# Patient Record
Sex: Male | Born: 1980 | Hispanic: No | Marital: Single | Smoking: Current every day smoker
Health system: Southern US, Community
[De-identification: ages and names within clinical notes are randomized; demographics above are authoritative.]

## PROBLEM LIST (undated history)

## (undated) DIAGNOSIS — I10 Essential (primary) hypertension: Secondary | ICD-10-CM

---
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
## 2014-05-12 ENCOUNTER — Encounter (HOSPITAL_BASED_OUTPATIENT_CLINIC_OR_DEPARTMENT_OTHER): Payer: Self-pay

## 2014-05-12 ENCOUNTER — Emergency Department (HOSPITAL_BASED_OUTPATIENT_CLINIC_OR_DEPARTMENT_OTHER)
Admission: EM | Admit: 2014-05-12 | Discharge: 2014-05-12 | Disposition: A | Discharge status: Home / Self Care | Payer: 59 | Attending: Emergency Medicine | Admitting: Emergency Medicine

## 2014-05-12 ENCOUNTER — Emergency Department (HOSPITAL_BASED_OUTPATIENT_CLINIC_OR_DEPARTMENT_OTHER): Payer: 59

## 2014-05-12 DIAGNOSIS — Z720 Tobacco use: Secondary | ICD-10-CM | POA: Diagnosis not present

## 2014-05-12 DIAGNOSIS — I10 Essential (primary) hypertension: Secondary | ICD-10-CM | POA: Diagnosis not present

## 2014-05-12 DIAGNOSIS — R0789 Other chest pain: Secondary | ICD-10-CM | POA: Diagnosis not present

## 2014-05-12 DIAGNOSIS — R079 Chest pain, unspecified: Secondary | ICD-10-CM | POA: Diagnosis present

## 2014-05-12 HISTORY — DX: Essential (primary) hypertension: I10

## 2014-05-12 MED ORDER — CYCLOBENZAPRINE HCL 10 MG PO TABS: 10.0000 mg | ORAL_TABLET | Freq: Once | ORAL | Status: AC

## 2014-05-12 MED ORDER — CYCLOBENZAPRINE HCL 10 MG PO TABS
10.0000 mg | ORAL_TABLET | Freq: Once | ORAL | Status: AC
  Administered 2014-05-12: 10 mg via ORAL
  Filled 2014-05-12: qty 1

## 2014-05-12 MED ORDER — IBUPROFEN 800 MG PO TABS
800.0000 mg | ORAL_TABLET | Freq: Once | ORAL | Status: AC
  Administered 2014-05-12: 800 mg via ORAL
  Filled 2014-05-12: qty 1

## 2014-05-12 MED ORDER — IBUPROFEN 800 MG PO TABS: 800.0000 mg | ORAL_TABLET | Freq: Three times a day (TID) | ORAL | Status: AC | PRN

## 2014-05-12 NOTE — ED Notes (Signed)
C/o right sided chest pain. Reports was worse when working last night. Denies pain at this time. Sts it's gotten better since getting off work.

## 2014-05-12 NOTE — ED Notes (Signed)
MD at bedside. 

## 2014-05-12 NOTE — Discharge Instructions (Signed)
Chest Wall Pain °Chest wall pain is pain in or around the bones and muscles of your chest. It may take up to 6 weeks to get better. It may take longer if you must stay physically active in your work and activities.  °CAUSES  °Chest wall pain may happen on its own. However, it may be caused by: °· A viral illness like the flu. °· Injury. °· Coughing. °· Exercise. °· Arthritis. °· Fibromyalgia. °· Shingles. °HOME CARE INSTRUCTIONS  °· Avoid overtiring physical activity. Try not to strain or perform activities that cause pain. This includes any activities using your chest or your abdominal and side muscles, especially if heavy weights are used. °· Put ice on the sore area. °¨ Put ice in a plastic bag. °¨ Place a towel between your skin and the bag. °¨ Leave the ice on for 15-20 minutes per hour while awake for the first 2 days. °· Only take over-the-counter or prescription medicines for pain, discomfort, or fever as directed by your caregiver. °SEEK IMMEDIATE MEDICAL CARE IF:  °· Your pain increases, or you are very uncomfortable. °· You have a fever. °· Your chest pain becomes worse. °· You have new, unexplained symptoms. °· You have nausea or vomiting. °· You feel sweaty or lightheaded. °· You have a cough with phlegm (sputum), or you cough up blood. °MAKE SURE YOU:  °· Understand these instructions. °· Will watch your condition. °· Will get help right away if you are not doing well or get worse. °Document Released: 03/12/2005 Document Revised: 06/04/2011 Document Reviewed: 11/06/2010 °ExitCare® Patient Information ©2015 [employer]. This information is not intended to replace advice given to you by your health care provider. Make sure you discuss any questions you have with your health care provider. ° °Hypertension °Hypertension, commonly called high blood pressure, is when the force of blood pumping through your arteries is too strong. Your arteries are the blood vessels that carry blood from your heart  throughout your body. A blood pressure reading consists of a higher number over a lower number, such as 110/72. The higher number (systolic) is the pressure inside your arteries when your heart pumps. The lower number (diastolic) is the pressure inside your arteries when your heart relaxes. Ideally you want your blood pressure below 120/80. °Hypertension forces your heart to work harder to pump blood. Your arteries may become narrow or stiff. Having hypertension puts you at risk for heart disease, stroke, and other problems.  °RISK FACTORS °Some risk factors for high blood pressure are controllable. Others are not.  °Risk factors you cannot control include:  °· Race. You may be at higher risk if you are African American. °· Age. Risk increases with age. °· Gender. Men are at higher risk than women before age 45 years. After age 65, women are at higher risk than men. °Risk factors you can control include: °· Not getting enough exercise or physical activity. °· Being overweight. °· Getting too much fat, sugar, calories, or salt in your diet. °· Drinking too much alcohol. °SIGNS AND SYMPTOMS °Hypertension does not usually cause signs or symptoms. Extremely high blood pressure (hypertensive crisis) may cause headache, anxiety, shortness of breath, and nosebleed. °DIAGNOSIS  °To check if you have hypertension, your health care provider will measure your blood pressure while you are seated, with your arm held at the level of your heart. It should be measured at least twice using the same arm. Certain conditions can cause a difference in blood pressure between   your right and left arms. A blood pressure reading that is higher than normal on one occasion does not mean that you need treatment. If one blood pressure reading is high, ask your health care provider about having it checked again. TREATMENT  Treating high blood pressure includes making lifestyle changes and possibly taking medicine. Living a healthy lifestyle can  help lower high blood pressure. You may need to change some of your habits. Lifestyle changes may include:  Following the DASH diet. This diet is high in fruits, vegetables, and whole grains. It is low in salt, red meat, and added sugars.  Getting at least 2 hours of brisk physical activity every week.  Losing weight if necessary.  Not smoking.  Limiting alcoholic beverages.  Learning ways to reduce stress. If lifestyle changes are not enough to get your blood pressure under control, your health care provider may prescribe medicine. You may need to take more than one. Work closely with your health care provider to understand the risks and benefits. HOME CARE INSTRUCTIONS  Have your blood pressure rechecked as directed by your health care provider.   Take medicines only as directed by your health care provider. Follow the directions carefully. Blood pressure medicines must be taken as prescribed. The medicine does not work as well when you skip doses. Skipping doses also puts you at risk for problems.   Do not smoke.   Monitor your blood pressure at home as directed by your health care provider. SEEK MEDICAL CARE IF:   You think you are having a reaction to medicines taken.  You have recurrent headaches or feel dizzy.  You have swelling in your ankles.  You have trouble with your vision. SEEK IMMEDIATE MEDICAL CARE IF:  You develop a severe headache or confusion.  You have unusual weakness, numbness, or feel faint.  You have severe chest or abdominal pain.  You vomit repeatedly.  You have trouble breathing. MAKE SURE YOU:   Understand these instructions.  Will watch your condition.  Will get help right away if you are not doing well or get worse. Document Released: 03/12/2005 Document Revised: 07/27/2013 Document Reviewed: 01/02/2013 [facility] Patient Information 2015 [place], [place]. This information is not intended to replace advice given to you by your  health care provider. Make sure you discuss any questions you have with your health care provider.  Chest Wall Pain Chest wall pain is pain in or around the bones and muscles of your chest. It may take up to 6 weeks to get better. It may take longer if you must stay physically active in your work and activities.  CAUSES  Chest wall pain may happen on its own. However, it may be caused by:  A viral illness like the flu.  Injury.  Coughing.  Exercise.  Arthritis.  Fibromyalgia.  Shingles. HOME CARE INSTRUCTIONS   Avoid overtiring physical activity. Try not to strain or perform activities that cause pain. This includes any activities using your chest or your abdominal and side muscles, especially if heavy weights are used.  Put ice on the sore area.  Put ice in a plastic bag.  Place a towel between your skin and the bag.  Leave the ice on for 15-20 minutes per hour while awake for the first 2 days.  Only take over-the-counter or prescription medicines for pain, discomfort, or fever as directed by your caregiver. SEEK IMMEDIATE MEDICAL CARE IF:   Your pain increases, or you are very uncomfortable.  You have a  fever.  Your chest pain becomes worse.  You have new, unexplained symptoms.  You have nausea or vomiting.  You feel sweaty or lightheaded.  You have a cough with phlegm (sputum), or you cough up blood. MAKE SURE YOU:   Understand these instructions.  Will watch your condition.  Will get help right away if you are not doing well or get worse. Document Released: 03/12/2005 Document Revised: 06/04/2011 Document Reviewed: 11/06/2010 [facility] Patient Information 2015 [place], [place]. This information is not intended to replace advice given to you by your health care provider. Make sure you discuss any questions you have with your health care provider.

## 2014-05-12 NOTE — ED Provider Notes (Signed)
CSN: [id number]     Arrival date & time 05/12/14  0909 History   First MD Initiated Contact with Patient 05/12/14 [phone]     Chief Complaint  Patient presents with  . Chest Pain     (Consider location/radiation/quality/duration/timing/severity/associated sxs/prior Treatment) Patient is a 34 y.o. male presenting with chest pain. The history is provided by the patient.  Chest Pain Pain location:  R chest Pain quality: aching   Pain radiates to:  Does not radiate Pain radiates to the back: no   Pain severity:  Mild Onset quality:  Gradual Timing:  Intermittent Progression:  Unchanged Chronicity:  New Context: movement   Relieved by:  Nothing Exacerbated by: Using R arm. Associated symptoms: no abdominal pain, no cough, no fever, no shortness of breath and not vomiting     Past Medical History  Diagnosis Date  . Hypertension    History reviewed. No pertinent past surgical history. No family history on file. History  Substance Use Topics  . Smoking status: Current Every Day Smoker -- 0.50 packs/day    Types: Cigarettes  . Smokeless tobacco: Not on file  . Alcohol Use: No    Review of Systems  Constitutional: Negative for fever.  Respiratory: Negative for cough and shortness of breath.   Cardiovascular: Positive for chest pain.  Gastrointestinal: Negative for vomiting and abdominal pain.  All other systems reviewed and are negative.     Allergies  Review of patient's allergies indicates no known allergies.  Home Medications   Prior to Admission medications   Not on File   BP 157/85 mmHg  Pulse 86  Temp(Src) 98.3 F (36.8 C) (Oral)  Resp 18  Ht  (1.702 m)  Wt 205 lb (92.987 kg)  BMI 32.10 kg/m2  SpO2 100% Physical Exam  Constitutional: He is oriented to person, place, and time. He appears well-developed and well-nourished. No distress.  HENT:  Head: Normocephalic and atraumatic.  Mouth/Throat: No oropharyngeal exudate.  Eyes: EOM are normal. Pupils  are equal, round, and reactive to light.  Neck: Normal range of motion. Neck supple.  Cardiovascular: Normal rate and regular rhythm.  Exam reveals no friction rub.   No murmur heard. Pulmonary/Chest: Effort normal and breath sounds normal. No respiratory distress. He has no wheezes. He has no rales. He exhibits tenderness (R central).  Abdominal: He exhibits no distension. There is no tenderness. There is no rebound.  Musculoskeletal: Normal range of motion. He exhibits no edema.  Neurological: He is alert and oriented to person, place, and time.  Skin: He is not diaphoretic.  Nursing note and vitals reviewed.   ED Course  Procedures (including critical care time) Labs Review Labs Reviewed - No data to display  Imaging Review Dg Chest 2 View  05/12/2014   CLINICAL DATA:  One day history of right-sided chest pain  EXAM: CHEST  2 VIEW  COMPARISON:  None.  FINDINGS: Lungs are clear. Heart size and pulmonary vascularity are normal. No adenopathy. No pneumothorax. No bone lesions.  IMPRESSION: No edema or consolidation.   Electronically Signed   By: Bretta Bang III M.D.   On: 05/12/2014 09:32     EKG Interpretation   Date/Time:  Wednesday May 12 2014 09:22:11 EST Ventricular Rate:  82 PR Interval:  166 QRS Duration: 82 QT Interval:  354 QTC Calculation: 413 R Axis:   59 Text Interpretation:  Normal sinus rhythm Normal ECG No prior for  comparison Confirmed by Gwendolyn Grant  MD, Chanetta Moosman ([id number]) on  05/12/2014 9:24:49 AM      MDM   Final diagnoses:  Chest wall pain  Essential hypertension    24M here with chest pain. Worse with movements. Pain with moving right arm occasionally, noticed it last night. Described as aching.  AFVSS here. Mildly hypertensive, counseled him on f/u with PCP to begin taking BP meds again - he quit after losing weight and stopping smoking.  His CP is reproducible with palpation of R central chest muscles. No concern for torn pectoralis muscle. Will  give NSAIDs, flexeril. Stable for discharge.    Elwin MochaBlair Rokhaya Quinn, MD 05/12/14 [phone]

## 2014-05-12 NOTE — ED Notes (Signed)
Patient transported to X-ray 

## 2014-10-08 ENCOUNTER — Emergency Department (HOSPITAL_BASED_OUTPATIENT_CLINIC_OR_DEPARTMENT_OTHER)
Admission: EM | Admit: 2014-10-08 | Discharge: 2014-10-08 | Disposition: A | Discharge status: Home / Self Care | Payer: 59 | Attending: Emergency Medicine | Admitting: Emergency Medicine

## 2014-10-08 ENCOUNTER — Encounter (HOSPITAL_BASED_OUTPATIENT_CLINIC_OR_DEPARTMENT_OTHER): Payer: Self-pay | Admitting: Emergency Medicine

## 2014-10-08 DIAGNOSIS — T2162XA Corrosion of second degree of abdominal wall, initial encounter: Secondary | ICD-10-CM | POA: Diagnosis not present

## 2014-10-08 DIAGNOSIS — X58XXXA Exposure to other specified factors, initial encounter: Secondary | ICD-10-CM | POA: Diagnosis not present

## 2014-10-08 DIAGNOSIS — Y998 Other external cause status: Secondary | ICD-10-CM | POA: Insufficient documentation

## 2014-10-08 DIAGNOSIS — I10 Essential (primary) hypertension: Secondary | ICD-10-CM | POA: Insufficient documentation

## 2014-10-08 DIAGNOSIS — T24632A Corrosion of second degree of left lower leg, initial encounter: Secondary | ICD-10-CM | POA: Diagnosis not present

## 2014-10-08 DIAGNOSIS — Y9389 Activity, other specified: Secondary | ICD-10-CM | POA: Insufficient documentation

## 2014-10-08 DIAGNOSIS — R21 Rash and other nonspecific skin eruption: Secondary | ICD-10-CM | POA: Insufficient documentation

## 2014-10-08 DIAGNOSIS — Y9289 Other specified places as the place of occurrence of the external cause: Secondary | ICD-10-CM | POA: Insufficient documentation

## 2014-10-08 DIAGNOSIS — Z720 Tobacco use: Secondary | ICD-10-CM | POA: Diagnosis not present

## 2014-10-08 DIAGNOSIS — T543X1A Toxic effect of corrosive alkalis and alkali-like substances, accidental (unintentional), initial encounter: Secondary | ICD-10-CM | POA: Insufficient documentation

## 2014-10-08 DIAGNOSIS — T304 Corrosion of unspecified body region, unspecified degree: Secondary | ICD-10-CM

## 2014-10-08 MED ORDER — SILVER SULFADIAZINE 1 % EX CREA
TOPICAL_CREAM | Freq: Once | CUTANEOUS | Status: AC
  Administered 2014-10-08: 1 via TOPICAL
  Filled 2014-10-08: qty 85

## 2014-10-08 MED ORDER — SILVER SULFADIAZINE 1 % EX CREA: TOPICAL_CREAM | CUTANEOUS | Status: AC

## 2014-10-08 MED ORDER — OXYCODONE-ACETAMINOPHEN 5-325 MG PO TABS: 1.0000 | ORAL_TABLET | Freq: Four times a day (QID) | ORAL | Status: AC | PRN

## 2014-10-08 MED ORDER — TETANUS-DIPHTH-ACELL PERTUSSIS 5-2.5-18.5 LF-MCG/0.5 IM SUSP
0.5000 mL | Freq: Once | INTRAMUSCULAR | Status: AC
  Administered 2014-10-08: 0.5 mL via INTRAMUSCULAR
  Filled 2014-10-08: qty 0.5

## 2014-10-08 NOTE — ED Notes (Signed)
silvadine cream applied to left lower lateral abdomen, covered with tefla pad, secured with paper tape. silvadine cream applied to left medial calf and right medial calf

## 2014-10-08 NOTE — Discharge Instructions (Signed)
Chemical Burn Many chemicals can burn the skin. A chemical burn should be flushed with cool water and checked by an emergency caregiver. Your skin is a natural barrier to infection. It is the largest organ of your body. Burns damage this natural protection. To help prevent infection, it is very important to follow your caregiver's instructions in the care of your burn.  Many industrial chemicals may cause burns. These chemicals include acids, alkalis, and organic compounds such as petroleum, phenol, bitumen, tar, and grease. When acids come in contact with the skin, they cause an immediate change in the skin.Acid burns produce significant pain and form a scab (eschar). Usually, the immediate skin changes are the only damage from an acid burn.However, exposure to formic acid, chromic acid, or hydrofluoric acid may affect the whole body and may even be life-threatening. Alkalis include lye, cement, lime, and many chemicals with "hydroxide" in their name.An alkali burn may be less apparent than an acid burn at first. However, alkalis may cause greater tissue damage.It is important to be aware of any chemicals you are using. Treat any exposure to skin, eyes, or mucous membranes (nose, mouth, throat) as a potential emergency. PREVENTION  Avoid exposure to toxic chemicals that can cause burns.  Store chemicals out of the reach of children.  Use protective gloves when handling dangerous chemicals. HOME CARE INSTRUCTIONS   Wash your hands well before changing your bandage.  Change your bandage as often as directed by your caregiver.  Remove the old bandage. If the bandage sticks, you may soak it off with cool, clean water.  Cleanse the burn thoroughly but gently with mild soap and water.  Pat the area dry with a clean, dry cloth.  Apply a thin layer of antibacterial cream to the burn.  Apply a clean bandage as instructed by your caregiver.  Keep the bandage as clean and dry as  possible.  Elevate the affected area for the first 24 hours, then as instructed by your caregiver.  Only take over-the-counter or prescription medicines for pain, discomfort, or fever as directed by your caregiver.  Keep all follow-up appointments.This is important. This is how your caregiver can tell if your treatment is working. SEEK IMMEDIATE MEDICAL CARE IF:   You develop excessive pain.  You develop redness, tenderness, swelling, or red streaks near the burn.  The burned area develops yellowish-white fluid (pus) or a bad smell.  You have a fever. MAKE SURE YOU:   Understand these instructions.  Will watch your condition.  Will get help right away if you are not doing well or get worse. Document Released: 12/17/2003 Document Revised: 06/04/2011 Document Reviewed: 08/07/2010 Exit[employer] Patient Information 2015 [employer]. This information is not intended to replace advice given to you by your health care provider. Make sure you discuss any questions you have with your health care provider.  
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
## 2014-10-08 NOTE — ED Notes (Signed)
Patient has a blistered area of skin with redness underneath to his left leg and left stomach area. Patient also has areas of a rash. Patient reports that he is having a burning pain. The patient also reports that it is itching.

## 2014-10-10 ENCOUNTER — Encounter (HOSPITAL_BASED_OUTPATIENT_CLINIC_OR_DEPARTMENT_OTHER): Payer: Self-pay | Admitting: Emergency Medicine

## 2014-10-10 DIAGNOSIS — Y939 Activity, unspecified: Secondary | ICD-10-CM | POA: Insufficient documentation

## 2014-10-10 DIAGNOSIS — T2162XA Corrosion of second degree of abdominal wall, initial encounter: Secondary | ICD-10-CM | POA: Insufficient documentation

## 2014-10-10 DIAGNOSIS — X58XXXA Exposure to other specified factors, initial encounter: Secondary | ICD-10-CM | POA: Diagnosis not present

## 2014-10-10 DIAGNOSIS — Y929 Unspecified place or not applicable: Secondary | ICD-10-CM | POA: Diagnosis not present

## 2014-10-10 DIAGNOSIS — T65894A Toxic effect of other specified substances, undetermined, initial encounter: Secondary | ICD-10-CM | POA: Diagnosis not present

## 2014-10-10 DIAGNOSIS — Z720 Tobacco use: Secondary | ICD-10-CM | POA: Diagnosis not present

## 2014-10-10 DIAGNOSIS — Y999 Unspecified external cause status: Secondary | ICD-10-CM | POA: Diagnosis not present

## 2014-10-10 DIAGNOSIS — I10 Essential (primary) hypertension: Secondary | ICD-10-CM | POA: Diagnosis not present

## 2014-10-10 DIAGNOSIS — T2142XA Corrosion of unspecified degree of abdominal wall, initial encounter: Secondary | ICD-10-CM | POA: Diagnosis present
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
## 2014-10-11 ENCOUNTER — Emergency Department (HOSPITAL_BASED_OUTPATIENT_CLINIC_OR_DEPARTMENT_OTHER)
Admission: EM | Admit: 2014-10-11 | Discharge: 2014-10-11 | Disposition: A | Discharge status: Home / Self Care | Payer: 59 | Attending: Emergency Medicine | Admitting: Emergency Medicine

## 2014-10-11 DIAGNOSIS — T304 Corrosion of unspecified body region, unspecified degree: Secondary | ICD-10-CM

## 2014-10-11 MED ORDER — HYDROCODONE-ACETAMINOPHEN 5-325 MG PO TABS
2.0000 | ORAL_TABLET | Freq: Once | ORAL | Status: AC
  Administered 2014-10-11: 2 via ORAL
  Filled 2014-10-11: qty 2

## 2014-10-11 MED ORDER — SILVER SULFADIAZINE 1 % EX CREA
TOPICAL_CREAM | CUTANEOUS | Status: AC
  Filled 2014-10-11: qty 85

## 2014-10-11 MED ORDER — NAPROXEN 250 MG PO TABS
500.0000 mg | ORAL_TABLET | Freq: Once | ORAL | Status: AC
Start: 2014-10-11 — End: 2014-10-11
  Administered 2014-10-11: 500 mg via ORAL
  Filled 2014-10-11: qty 2

## 2014-10-11 MED ORDER — HYDROCODONE-ACETAMINOPHEN 5-325 MG PO TABS: 1.0000 | ORAL_TABLET | Freq: Four times a day (QID) | ORAL | Status: AC | PRN

## 2014-10-11 MED ORDER — SILVER SULFADIAZINE 1 % EX CREA
TOPICAL_CREAM | Freq: Once | CUTANEOUS | Status: AC
  Administered 2014-10-11: 02:00:00 via TOPICAL
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
## 2014-10-11 NOTE — Discharge Instructions (Signed)
Chemical Burn Many chemicals can burn the skin. A chemical burn should be flushed with cool water and checked by an emergency caregiver. Your skin is a natural barrier to infection. It is the largest organ of your body. Burns damage this natural protection. To help prevent infection, it is very important to follow your caregiver's instructions in the care of your burn.  Many industrial chemicals may cause burns. These chemicals include acids, alkalis, and organic compounds such as petroleum, phenol, bitumen, tar, and grease. When acids come in contact with the skin, they cause an immediate change in the skin.Acid burns produce significant pain and form a scab (eschar). Usually, the immediate skin changes are the only damage from an acid burn.However, exposure to formic acid, chromic acid, or hydrofluoric acid may affect the whole body and may even be life-threatening. Alkalis include lye, cement, lime, and many chemicals with "hydroxide" in their name.An alkali burn may be less apparent than an acid burn at first. However, alkalis may cause greater tissue damage.It is important to be aware of any chemicals you are using. Treat any exposure to skin, eyes, or mucous membranes (nose, mouth, throat) as a potential emergency. PREVENTION  Avoid exposure to toxic chemicals that can cause burns.  Store chemicals out of the reach of children.  Use protective gloves when handling dangerous chemicals. HOME CARE INSTRUCTIONS   Wash your hands well before changing your bandage.  Change your bandage as often as directed by your caregiver.  Remove the old bandage. If the bandage sticks, you may soak it off with cool, clean water.  Cleanse the burn thoroughly but gently with mild soap and water.  Pat the area dry with a clean, dry cloth.  Apply a thin layer of antibacterial cream to the burn.  Apply a clean bandage as instructed by your caregiver.  Keep the bandage as clean and dry as  possible.  Elevate the affected area for the first 24 hours, then as instructed by your caregiver.  Only take over-the-counter or prescription medicines for pain, discomfort, or fever as directed by your caregiver.  Keep all follow-up appointments.This is important. This is how your caregiver can tell if your treatment is working. SEEK IMMEDIATE MEDICAL CARE IF:   You develop excessive pain.  You develop redness, tenderness, swelling, or red streaks near the burn.  The burned area develops yellowish-white fluid (pus) or a bad smell.  You have a fever. MAKE SURE YOU:   Understand these instructions.  Will watch your condition.  Will get help right away if you are not doing well or get worse. Document Released: 12/17/2003 Document Revised: 06/04/2011 Document Reviewed: 08/07/2010 Exit[employer] Patient Information 2015 [employer]. This information is not intended to replace advice given to you by your health care provider. Make sure you discuss any questions you have with your health care provider.  

## 2014-10-14 MED ORDER — SILVER SULFADIAZINE 1 % EX CREA: 1.0000 "application " | TOPICAL_CREAM | Freq: Every day | CUTANEOUS | Status: AC

## 2014-10-14 NOTE — ED Provider Notes (Signed)
Pt called in to ED for additional silvadene. Previously noted reviewed. He reports to me that he is doing much better. No continued progression of skin lesions. Still having pain, but progressively getting better. Simply out of silvadene at this point and also requesting work note. Pt says he can come by [facility]. Will have script and note available for him to pick up.   Raeford Razor, MD 10/14/14 [phone]

## 2017-08-08 ENCOUNTER — Encounter (HOSPITAL_BASED_OUTPATIENT_CLINIC_OR_DEPARTMENT_OTHER): Payer: Self-pay | Admitting: *Deleted

## 2017-08-08 ENCOUNTER — Emergency Department (HOSPITAL_BASED_OUTPATIENT_CLINIC_OR_DEPARTMENT_OTHER): Payer: 59

## 2017-08-08 ENCOUNTER — Emergency Department (HOSPITAL_BASED_OUTPATIENT_CLINIC_OR_DEPARTMENT_OTHER)
Admission: EM | Admit: 2017-08-08 | Discharge: 2017-08-08 | Disposition: A | Discharge status: Home / Self Care | Payer: 59 | Attending: Emergency Medicine | Admitting: Emergency Medicine

## 2017-08-08 ENCOUNTER — Other Ambulatory Visit: Payer: Self-pay

## 2017-08-08 DIAGNOSIS — R070 Pain in throat: Secondary | ICD-10-CM | POA: Diagnosis not present

## 2017-08-08 DIAGNOSIS — Z79899 Other long term (current) drug therapy: Secondary | ICD-10-CM | POA: Insufficient documentation

## 2017-08-08 DIAGNOSIS — R079 Chest pain, unspecified: Secondary | ICD-10-CM | POA: Insufficient documentation

## 2017-08-08 DIAGNOSIS — F17210 Nicotine dependence, cigarettes, uncomplicated: Secondary | ICD-10-CM | POA: Insufficient documentation

## 2017-08-08 DIAGNOSIS — I10 Essential (primary) hypertension: Secondary | ICD-10-CM | POA: Diagnosis not present

## 2017-08-08 DIAGNOSIS — J029 Acute pharyngitis, unspecified: Secondary | ICD-10-CM | POA: Diagnosis present

## 2017-08-08 LAB — BASIC METABOLIC PANEL
Anion gap: 10 (ref 5–15)
BUN: 21 mg/dL — ABNORMAL HIGH (ref 6–20)
CHLORIDE: 105 mmol/L (ref 101–111)
CO2: 22 mmol/L (ref 22–32)
CREATININE: 1.06 mg/dL (ref 0.61–1.24)
Calcium: 9.3 mg/dL (ref 8.9–10.3)
Glucose, Bld: 94 mg/dL (ref 65–99)
POTASSIUM: 4.3 mmol/L (ref 3.5–5.1)
SODIUM: 137 mmol/L (ref 135–145)

## 2017-08-08 LAB — CBC WITH DIFFERENTIAL/PLATELET
BASOS PCT: 0 %
Basophils Absolute: 0.1 10*3/uL (ref 0.0–0.1)
EOS ABS: 0.6 10*3/uL (ref 0.0–0.7)
Eosinophils Relative: 4 %
HCT: 48.5 % (ref 39.0–52.0)
Hemoglobin: 17.4 g/dL — ABNORMAL HIGH (ref 13.0–17.0)
Lymphocytes Relative: 32 %
Lymphs Abs: 5.5 10*3/uL — ABNORMAL HIGH (ref 0.7–4.0)
MCH: 29.9 pg (ref 26.0–34.0)
MCHC: 35.9 g/dL (ref 30.0–36.0)
MCV: 83.5 fL (ref 78.0–100.0)
Monocytes Absolute: 1.4 10*3/uL — ABNORMAL HIGH (ref 0.1–1.0)
Monocytes Relative: 8 %
Neutro Abs: 9.6 10*3/uL — ABNORMAL HIGH (ref 1.7–7.7)
Neutrophils Relative %: 56 %
Platelets: 359 10*3/uL (ref 150–400)
RBC: 5.81 MIL/uL (ref 4.22–5.81)
RDW: 13 % (ref 11.5–15.5)
WBC: 17.2 10*3/uL — ABNORMAL HIGH (ref 4.0–10.5)

## 2017-08-08 LAB — TROPONIN I

## 2017-08-08 LAB — RAPID STREP SCREEN (MED CTR MEBANE ONLY): Streptococcus, Group A Screen (Direct): NEGATIVE

## 2017-08-08 NOTE — ED Notes (Signed)
Patient transported to X-ray 

## 2017-08-08 NOTE — Discharge Instructions (Addendum)
Ibuprofen 600 mg every 6 hours as needed for pain.  Rest.  Return to the emergency department if you develop worsening pain, difficulty breathing or swallowing, or other new and concerning symptoms.

## 2017-08-08 NOTE — ED Triage Notes (Signed)
C/o throbbing pain in his throat and head, (denies fevers) since last Tuesday night. Last took aspirin for pain tonight around 9pm

## 2017-08-08 NOTE — ED Notes (Signed)
ED Provider at bedside. 

## 2017-08-08 NOTE — ED Provider Notes (Signed)
[facility] EMERGENCY DEPARTMENT Provider Note   CSN: [id number] Arrival date & time: 08/08/17  0118     History   Chief Complaint Chief Complaint  Patient presents with  . Sore Throat    HPI Ronnie Welch is a 37 y.o. male.  Patient is a 37 year old male with history of hypertension.  He presents today for evaluation of throat/neck pain.  This has been ongoing for the past week.  He states that the pain is radiated into his head and upper chest.  It is worse when he bends over and does lifting at work.  He denies any shortness of breath, nausea, or diaphoresis.  The history is provided by the patient.  Sore Throat  This is a new problem. Episode onset: 1 week ago. The problem occurs constantly. The problem has been gradually worsening. Associated symptoms include chest pain and headaches. Pertinent negatives include no abdominal pain and no shortness of breath. Exacerbated by: Movement and exertion. Nothing relieves the symptoms. He has tried nothing for the symptoms.    Past Medical History:  Diagnosis Date  . Hypertension     There are no active problems to display for this patient.   History reviewed. No pertinent surgical history.      Home Medications    Prior to Admission medications   Medication Sig Start Date End Date Taking? Authorizing Provider  cyclobenzaprine (FLEXERIL) 10 MG tablet Take 1 tablet (10 mg total) by mouth once. 05/12/14   Elwin Mocha, MD  HYDROcodone-acetaminophen (NORCO/VICODIN) 5-325 MG per tablet Take 1-2 tablets by mouth every 6 (six) hours as needed (for pain). 10/11/14   Molpus, John, MD  ibuprofen (ADVIL,MOTRIN) 800 MG tablet Take 1 tablet (800 mg total) by mouth every 8 (eight) hours as needed. 05/12/14   Elwin Mocha, MD  oxyCODONE-acetaminophen (PERCOCET) 5-325 MG per tablet Take 1-2 tablets by mouth every 6 (six) hours as needed (for pain). 10/08/14   Molpus, John, MD  silver sulfADIAZINE (SILVADENE) 1 % cream Applied to  burns daily. Wash off all doubly or before applying new layer. 10/08/14   Molpus, John, MD  silver sulfADIAZINE (SILVADENE) 1 % cream Apply 1 application topically daily. 10/14/14   Raeford Razor, MD    Family History No family history on file.  Social History Social History   Tobacco Use  . Smoking status: Current Every Day Smoker    Packs/day: 0.50    Types: Cigarettes  Substance Use Topics  . Alcohol use: No  . Drug use: Not on file     Allergies   Patient has no known allergies.   Review of Systems Review of Systems  Respiratory: Negative for shortness of breath.   Cardiovascular: Positive for chest pain.  Gastrointestinal: Negative for abdominal pain.  Neurological: Positive for headaches.  All other systems reviewed and are negative.    Physical Exam Updated Vital Signs BP (!) 150/110 (BP Location: Right Arm)   Pulse 87   Temp 97.8 F (36.6 C) (Oral)   Resp 16   SpO2 100%   Physical Exam  Constitutional: He is oriented to person, place, and time. He appears well-developed and well-nourished. No distress.  HENT:  Head: Normocephalic and atraumatic.  Mouth/Throat: Oropharynx is clear and moist and mucous membranes are normal. No oropharyngeal exudate, posterior oropharyngeal edema, posterior oropharyngeal erythema or tonsillar abscesses.  Neck: Normal range of motion. Neck supple.  Cardiovascular: Normal rate and regular rhythm. Exam reveals no friction rub.  No murmur heard.  Pulmonary/Chest: Effort normal and breath sounds normal. No respiratory distress. He has no wheezes. He has no rales.  Abdominal: Soft. Bowel sounds are normal. He exhibits no distension. There is no tenderness.  Musculoskeletal: Normal range of motion. He exhibits no edema.  Neurological: He is alert and oriented to person, place, and time. Coordination normal.  Skin: Skin is warm and dry. He is not diaphoretic.  Nursing note and vitals reviewed.    ED Treatments / Results   Labs (all labs ordered are listed, but only abnormal results are displayed) Labs Reviewed  RAPID STREP SCREEN ([facility] ONLY)  CULTURE, GROUP A STREP [facility])    EKG None  Radiology No results found.  Procedures Procedures (including critical care time)  Medications Ordered in ED Medications - No data to display   Initial Impression / Assessment and Plan / ED Course  I have reviewed the triage vital signs and the nursing notes.  Pertinent labs & imaging results that were available during my care of the patient were reviewed by me and considered in my medical decision making (see chart for details).  Patient presents with pain in his throat several days ago that has now moved into his chest.  I am uncertain as to the etiology of his discomfort, however nothing appears emergent.  His cardiac work-up is unremarkable and physical examination reveals no obvious abnormality.  I highly doubt a cardiac etiology.  He will be discharged with anti-inflammatory medications and follow-up as needed.  Final Clinical Impressions(s) / ED Diagnoses   Final diagnoses:  None    ED Discharge Orders    None       Geoffery Lyons, MD 08/08/17 [phone]

## 2017-08-10 LAB — CULTURE, GROUP A STREP (THRC)

## 2019-07-15 IMAGING — CR DG CHEST 2V
2 series · 2 of 2 positions shown · non-contrast
Comparison: 05/12/2014

CLINICAL DATA: Anterior chest pain and throat pain

EXAM:
CHEST - 2 VIEW

[w chest pa]
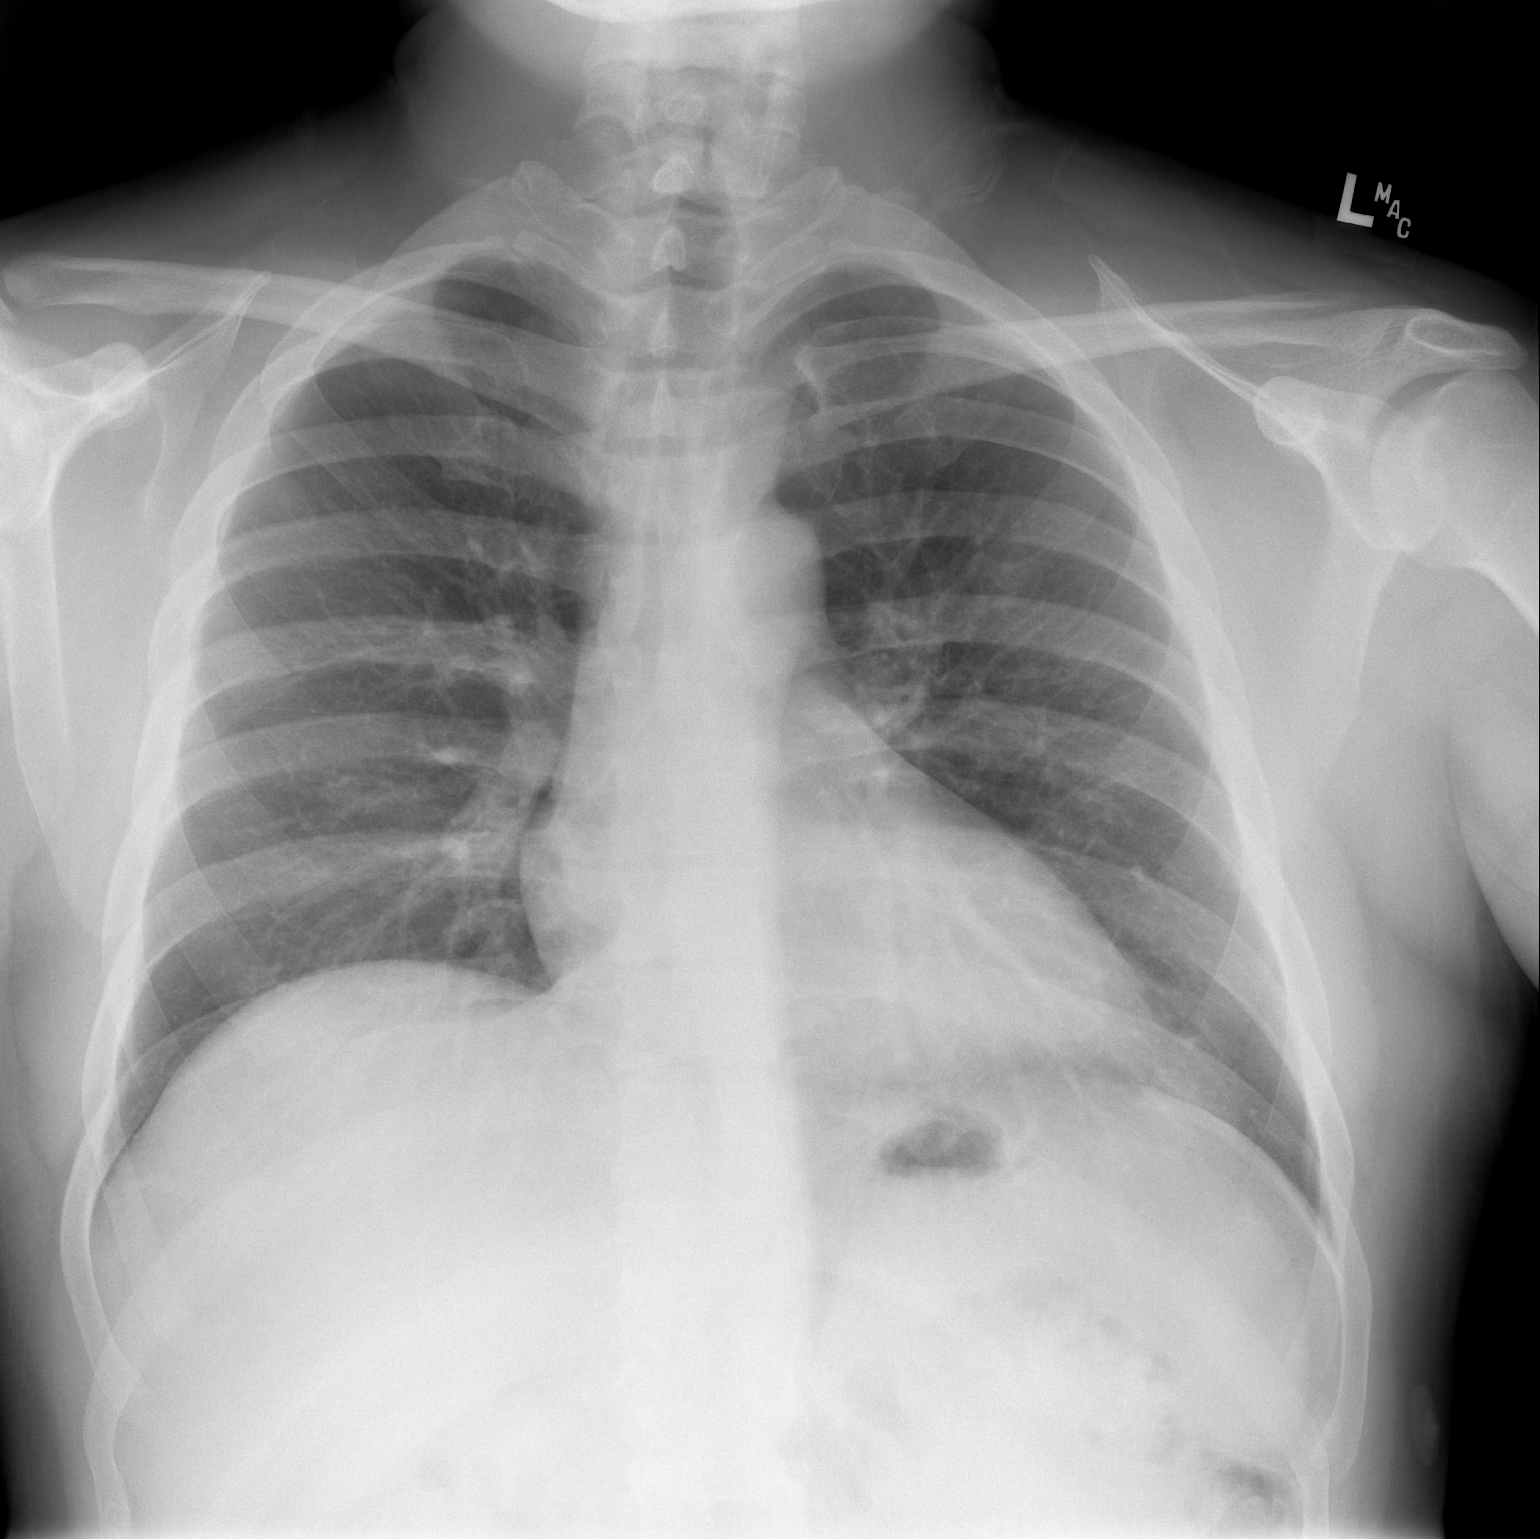

[w chest lat]
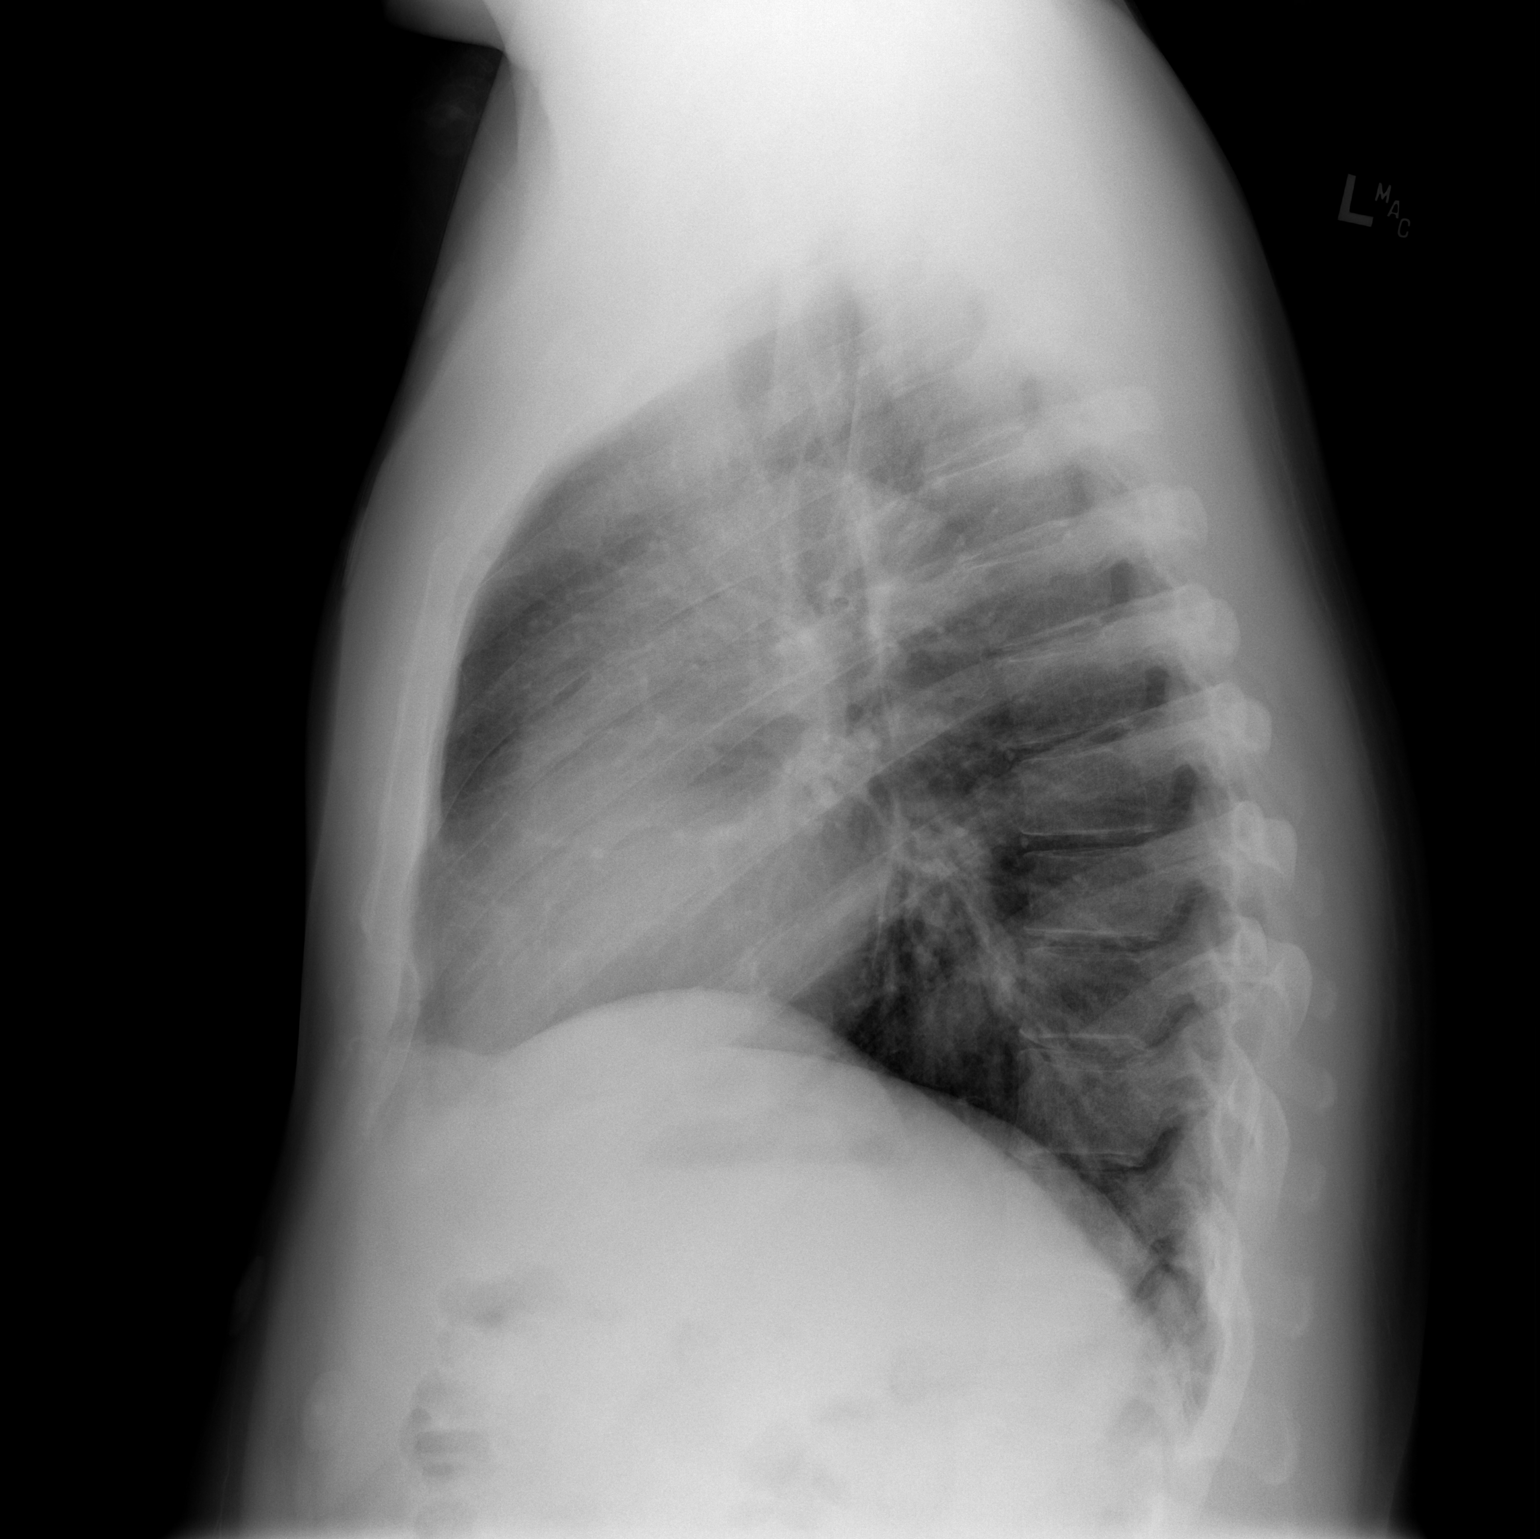

[2 of 2 positions shown; findings below may reference images not displayed]

FINDINGS: The heart size and mediastinal contours are within normal limits.
Both lungs are clear. The visualized skeletal structures are
unremarkable.
IMPRESSION: No active cardiopulmonary disease.

## 2022-07-04 ENCOUNTER — Other Ambulatory Visit: Payer: Self-pay

## 2022-07-04 ENCOUNTER — Emergency Department (HOSPITAL_BASED_OUTPATIENT_CLINIC_OR_DEPARTMENT_OTHER)
Admission: EM | Admit: 2022-07-04 | Discharge: 2022-07-05 | Disposition: A | Discharge status: Home / Self Care | Payer: 59 | Attending: Emergency Medicine | Admitting: Emergency Medicine

## 2022-07-04 ENCOUNTER — Encounter (HOSPITAL_BASED_OUTPATIENT_CLINIC_OR_DEPARTMENT_OTHER): Payer: Self-pay | Admitting: Emergency Medicine

## 2022-07-04 DIAGNOSIS — I10 Essential (primary) hypertension: Secondary | ICD-10-CM

## 2022-07-04 DIAGNOSIS — J019 Acute sinusitis, unspecified: Secondary | ICD-10-CM | POA: Insufficient documentation

## 2022-07-04 DIAGNOSIS — H1032 Unspecified acute conjunctivitis, left eye: Secondary | ICD-10-CM | POA: Diagnosis not present

## 2022-07-04 DIAGNOSIS — R519 Headache, unspecified: Secondary | ICD-10-CM | POA: Diagnosis present

## 2022-07-04 NOTE — ED Triage Notes (Signed)
"  Bump" on eye lid/ inside of eye, now eye is irritated and red Denies vision changes   Also c/o frequent headache. Medicine to treat headaches daily. Would like to talk to someone about them. Denies headache at this time

## 2022-07-04 NOTE — ED Provider Notes (Addendum)
[place] EMERGENCY DEPARTMENT AT [facility] Provider Note   CSN: [id number] Arrival date & time: 07/04/22  2056     History  Chief Complaint  Patient presents with   Eye Problem   Headache    Ronnie Welch is a 42 y.o. male.  Patient complains of redness to his left eye.  Patient reports that a little lump came up on his eye and then drained.  Patient reports he has had trouble with allergies this year.  Patient's wife is concerned about patient's blood pressure.  She reports patient also had a severe headache 2 days ago.  She reports patient collapsed with a headache.  The history is provided by the patient. No language interpreter was used.  Eye Problem Location:  Left eye Quality:  Stinging Severity:  Mild Timing:  Constant Progression:  Worsening Chronicity:  New Relieved by:  Nothing Worsened by:  Nothing Ineffective treatments:  None tried Associated symptoms: headaches   Headache      Home Medications Prior to Admission medications   Medication Sig Start Date End Date Taking? Authorizing Provider  cyclobenzaprine (FLEXERIL) 10 MG tablet Take 1 tablet (10 mg total) by mouth once. 05/12/14   Elwin Mocha, MD  HYDROcodone-acetaminophen (NORCO/VICODIN) 5-325 MG per tablet Take 1-2 tablets by mouth every 6 (six) hours as needed (for pain). 10/11/14   Molpus, John, MD  ibuprofen (ADVIL,MOTRIN) 800 MG tablet Take 1 tablet (800 mg total) by mouth every 8 (eight) hours as needed. 05/12/14   Elwin Mocha, MD  oxyCODONE-acetaminophen (PERCOCET) 5-325 MG per tablet Take 1-2 tablets by mouth every 6 (six) hours as needed (for pain). 10/08/14   Molpus, John, MD  silver sulfADIAZINE (SILVADENE) 1 % cream Applied to burns daily. Wash off all doubly or before applying new layer. 10/08/14   Molpus, John, MD  silver sulfADIAZINE (SILVADENE) 1 % cream Apply 1 application topically daily. 10/14/14   Raeford Razor, MD      Allergies    Patient has no known allergies.     Review of Systems   Review of Systems  Neurological:  Positive for headaches.  All other systems reviewed and are negative.   Physical Exam Updated Vital Signs BP (!) 166/108 (BP Location: Right Arm)   Pulse 86   Temp 98.2 F (36.8 C)   Resp 16   SpO2 100%  Physical Exam Vitals reviewed.  Constitutional:      Appearance: He is well-developed.  HENT:     Head: Normocephalic.  Eyes:     Extraocular Movements: Extraocular movements intact.     Pupils: Pupils are equal, round, and reactive to light.     Comments: Injected left medial conjunctiva   Cardiovascular:     Rate and Rhythm: Normal rate and regular rhythm.  Pulmonary:     Effort: Pulmonary effort is normal.  Musculoskeletal:     Cervical back: Normal range of motion and neck supple.  Skin:    General: Skin is warm.  Neurological:     Mental Status: He is alert.  Psychiatric:        Mood and Affect: Mood normal.     ED Results / Procedures / Treatments   Labs (all labs ordered are listed, but only abnormal results are displayed) Labs Reviewed - No data to display  EKG None  Radiology No results found.  Procedures Procedures    Medications Ordered in ED Medications - No data to display  ED Course/ Medical Decision Making/ A&P  Medical Decision Making Pt complains of a headache 3 days ago that was sudden and severe.   Amount and/or Complexity of Data Reviewed Independent Historian: spouse    Details: Pt's wife gives history and is concerned about pt's blood pressure and headahces Labs: ordered. Radiology: ordered.   Pt's care turned over to Dr. Ivan Anchors and labs pending         Final Clinical Impression(s) / ED Diagnoses Final diagnoses:  Acute conjunctivitis of left eye, unspecified acute conjunctivitis type    Rx / DC Orders ED Discharge Orders     None         Osie Cheeks 07/04/22 2355    Elson Areas, PA-C 07/04/22  2355    Pollyann Savoy, MD 07/05/22 [phone]

## 2022-07-05 ENCOUNTER — Emergency Department (HOSPITAL_BASED_OUTPATIENT_CLINIC_OR_DEPARTMENT_OTHER): Payer: 59

## 2022-07-05 LAB — CBC WITH DIFFERENTIAL/PLATELET
Abs Immature Granulocytes: 0.06 10*3/uL (ref 0.00–0.07)
Basophils Absolute: 0.1 10*3/uL (ref 0.0–0.1)
Basophils Relative: 1 %
Eosinophils Absolute: 0.4 10*3/uL (ref 0.0–0.5)
Eosinophils Relative: 3 %
HCT: 43.4 % (ref 39.0–52.0)
Hemoglobin: 15.2 g/dL (ref 13.0–17.0)
Immature Granulocytes: 0 %
Lymphocytes Relative: 37 %
Lymphs Abs: 5.7 10*3/uL — ABNORMAL HIGH (ref 0.7–4.0)
MCH: 29.3 pg (ref 26.0–34.0)
MCHC: 35 g/dL (ref 30.0–36.0)
MCV: 83.8 fL (ref 80.0–100.0)
Monocytes Absolute: 1 10*3/uL (ref 0.1–1.0)
Monocytes Relative: 6 %
Neutro Abs: 8.1 10*3/uL — ABNORMAL HIGH (ref 1.7–7.7)
Neutrophils Relative %: 53 %
Platelets: 374 10*3/uL (ref 150–400)
RBC: 5.18 MIL/uL (ref 4.22–5.81)
RDW: 12.6 % (ref 11.5–15.5)
WBC: 15.4 10*3/uL — ABNORMAL HIGH (ref 4.0–10.5)
nRBC: 0 % (ref 0.0–0.2)

## 2022-07-05 LAB — BASIC METABOLIC PANEL
Anion gap: 9 (ref 5–15)
BUN: 15 mg/dL (ref 6–20)
CO2: 26 mmol/L (ref 22–32)
Calcium: 9.4 mg/dL (ref 8.9–10.3)
Chloride: 105 mmol/L (ref 98–111)
Creatinine, Ser: 0.95 mg/dL (ref 0.61–1.24)
GFR, Estimated: >60 mL/min (ref >60)
Glucose, Bld: 96 mg/dL (ref 70–99)
Potassium: 3.9 mmol/L (ref 3.5–5.1)
Sodium: 140 mmol/L (ref 135–145)

## 2022-07-05 MED ORDER — AMOXICILLIN-POT CLAVULANATE 875-125 MG PO TABS: 1.0000 | ORAL_TABLET | Freq: Two times a day (BID) | ORAL | 0 refills | Status: AC

## 2022-07-05 MED ORDER — MOXIFLOXACIN HCL 0.5 % OP SOLN: 1.0000 [drp] | Freq: Three times a day (TID) | OPHTHALMIC | 0 refills | Status: AC

## 2022-07-05 MED ORDER — LISINOPRIL 10 MG PO TABS: 10.0000 mg | ORAL_TABLET | Freq: Every day | ORAL | 2 refills | Status: DC

## 2022-07-05 NOTE — ED Provider Notes (Signed)
Care of the patient assumed at the change of shift. Here for conjunctivitis and also for chronic headaches. Patient was previously on lisinopril for HTN >10 years ago but not on anything now.  Physical Exam  BP (!) 166/108 (BP Location: Right Arm)   Pulse 86   Temp 98.2 F (36.8 C)   Resp 16   SpO2 100%   Physical Exam Conjunctival injection Neuro is non focal  Procedures  Procedures  ED Course / MDM   Clinical Course as of 07/05/22 0108  Thu Jul 05, 2022  0036 CBC with mild leukocytosis. No recent to compare.  [CS]  0046 I personally viewed the images from radiology studies and agree with radiologist interpretation: CT neg for acute bleed. There is signs of sinusitis which may be contributing to his headaches and leukocytosis.  [CS]  0103 BMP is unremarkable. No signs of end organ damage from HTN. Plan discharge with Rx for eye drops for conjunctivitis, augmentin for sinusitis and restart lisinopril. Recommend he re-establish with a PCP for long term management. RTED for any other concerns.  [CS]    Clinical Course User Index [CS] Pollyann Savoy, MD   Medical Decision Making Problems Addressed: Acute conjunctivitis of left eye, unspecified acute conjunctivitis type: acute illness or injury Acute sinusitis, recurrence not specified, unspecified location: acute illness or injury Uncontrolled hypertension: chronic illness or injury  Amount and/or Complexity of Data Reviewed Labs: ordered. Decision-making details documented in ED Course. Radiology: ordered and independent interpretation performed. Decision-making details documented in ED Course.  Risk Prescription drug management.          Pollyann Savoy, MD 07/05/22 [phone]

## 2023-08-27 ENCOUNTER — Telehealth

## 2023-11-12 ENCOUNTER — Emergency Department (HOSPITAL_BASED_OUTPATIENT_CLINIC_OR_DEPARTMENT_OTHER)
Admission: EM | Admit: 2023-11-12 | Discharge: 2023-11-13 | Disposition: A | Discharge status: Home / Self Care | Attending: Emergency Medicine | Admitting: Emergency Medicine

## 2023-11-12 ENCOUNTER — Other Ambulatory Visit: Payer: Self-pay

## 2023-11-12 ENCOUNTER — Encounter (HOSPITAL_BASED_OUTPATIENT_CLINIC_OR_DEPARTMENT_OTHER): Payer: Self-pay | Admitting: Emergency Medicine

## 2023-11-12 DIAGNOSIS — M549 Dorsalgia, unspecified: Secondary | ICD-10-CM | POA: Diagnosis present

## 2023-11-12 DIAGNOSIS — M5432 Sciatica, left side: Secondary | ICD-10-CM | POA: Diagnosis not present

## 2023-11-12 NOTE — ED Triage Notes (Signed)
 Pt reports low back pain that started last Wednesday, denies any injury or trauma to the area, now radiating down the LT hip

## 2023-11-13 MED ORDER — METHOCARBAMOL 500 MG PO TABS: 500.0000 mg | ORAL_TABLET | Freq: Three times a day (TID) | ORAL | 0 refills | Status: AC | PRN

## 2023-11-13 MED ORDER — DEXAMETHASONE SODIUM PHOSPHATE 10 MG/ML IJ SOLN
10.0000 mg | Freq: Once | INTRAMUSCULAR | Status: AC
  Administered 2023-11-13: 10 mg via INTRAMUSCULAR
  Filled 2023-11-13: qty 1

## 2023-11-13 MED ORDER — METHYLPREDNISOLONE 4 MG PO TBPK: ORAL_TABLET | ORAL | 0 refills | Status: AC

## 2023-11-13 MED ORDER — LISINOPRIL 10 MG PO TABS: 10.0000 mg | ORAL_TABLET | Freq: Every day | ORAL | 3 refills | Status: AC

## 2023-11-13 MED ORDER — KETOROLAC TROMETHAMINE 60 MG/2ML IM SOLN
60.0000 mg | Freq: Once | INTRAMUSCULAR | Status: AC
  Administered 2023-11-13: 60 mg via INTRAMUSCULAR
  Filled 2023-11-13: qty 2

## 2023-11-13 NOTE — ED Notes (Signed)
 Pt here today for back pain that started last wed, denies any injury Appears to be sciatica.  Pain radiates to left hip

## 2023-11-13 NOTE — ED Provider Notes (Signed)
 [place] EMERGENCY DEPARTMENT AT [facility] Provider Note   CSN: [id number] Arrival date & time: 11/12/23  2242     Patient presents with: Back Pain   Ronnie Welch is a 43 y.o. male.   Patient presents to the emergency department for evaluation of back pain.  Patient reports that the pain is mostly behind the left hip but radiates down the buttock to the back of the thigh.  Denies injury.  Got better over the weekend when he was not working, started hurting again when he went back to work this week.  No change in bowel or bladder function.       Prior to Admission medications   Medication Sig Start Date End Date Taking? Authorizing Provider  methocarbamol  (ROBAXIN ) 500 MG tablet Take 1 tablet (500 mg total) by mouth every 8 (eight) hours as needed for muscle spasms. 11/13/23  Yes Glyndon Tursi, Lonni PARAS, MD  methylPREDNISolone  (MEDROL  DOSEPAK) 4 MG TBPK tablet As directed 11/13/23  Yes Shamir Tuzzolino, Lonni PARAS, MD  amoxicillin -clavulanate (AUGMENTIN ) 875-125 MG tablet Take 1 tablet by mouth every 12 (twelve) hours. 07/05/22   Roselyn Carlin NOVAK, MD  cyclobenzaprine  (FLEXERIL ) 10 MG tablet Take 1 tablet (10 mg total) by mouth once. 05/12/14   Elpidio Lamp, MD  HYDROcodone -acetaminophen  (NORCO/VICODIN) 5-325 MG per tablet Take 1-2 tablets by mouth every 6 (six) hours as needed (for pain). 10/11/14   Molpus, John, MD  ibuprofen  (ADVIL ,MOTRIN ) 800 MG tablet Take 1 tablet (800 mg total) by mouth every 8 (eight) hours as needed. 05/12/14   Elpidio Lamp, MD  lisinopril  (ZESTRIL ) 10 MG tablet Take 1 tablet (10 mg total) by mouth daily. 11/13/23   Haze Lonni PARAS, MD  oxyCODONE -acetaminophen  (PERCOCET) 5-325 MG per tablet Take 1-2 tablets by mouth every 6 (six) hours as needed (for pain). 10/08/14   Molpus, John, MD  silver  sulfADIAZINE  (SILVADENE ) 1 % cream Applied to burns daily. Wash off all doubly or before applying new layer. 10/08/14   Molpus, John, MD  silver  sulfADIAZINE   (SILVADENE ) 1 % cream Apply 1 application topically daily. 10/14/14   Loetta Senior, MD    Allergies: Patient has no known allergies.    Review of Systems  Updated Vital Signs BP (!) 145/98 (BP Location: Left Arm)   Pulse 74   Temp 98.2 F (36.8 C)   Resp 18   Ht 5' 6 (1.676 m)   Wt 83.9 kg   SpO2 100%   BMI 29.86 kg/m   Physical Exam Vitals and nursing note reviewed.  Constitutional:      General: He is not in acute distress.    Appearance: He is well-developed.  HENT:     Head: Normocephalic and atraumatic.     Mouth/Throat:     Mouth: Mucous membranes are moist.  Eyes:     General: Vision grossly intact. Gaze aligned appropriately.     Extraocular Movements: Extraocular movements intact.     Conjunctiva/sclera: Conjunctivae normal.  Cardiovascular:     Rate and Rhythm: Normal rate and regular rhythm.     Pulses: Normal pulses.     Heart sounds: Normal heart sounds, S1 normal and S2 normal. No murmur heard.    No friction rub. No gallop.  Pulmonary:     Effort: Pulmonary effort is normal. No respiratory distress.     Breath sounds: Normal breath sounds.  Abdominal:     Palpations: Abdomen is soft.     Tenderness: There is no abdominal tenderness. There  is no guarding or rebound.     Hernia: No hernia is present.  Musculoskeletal:        General: No swelling.     Cervical back: Full passive range of motion without pain, normal range of motion and neck supple. No pain with movement, spinous process tenderness or muscular tenderness. Normal range of motion.     Right lower leg: No edema.     Left lower leg: No edema.  Skin:    General: Skin is warm and dry.     Capillary Refill: Capillary refill takes less than 2 seconds.     Findings: No ecchymosis, erythema, lesion or wound.  Neurological:     Mental Status: He is alert and oriented to person, place, and time.     GCS: GCS eye subscore is 4. GCS verbal subscore is 5. GCS motor subscore is 6.     Cranial  Nerves: Cranial nerves 2-12 are intact.     Sensory: Sensation is intact.     Motor: Motor function is intact. No weakness or abnormal muscle tone.     Coordination: Coordination is intact.     Comments: Normal strength, sensation.  No foot drop.  No saddle anesthesia.  Psychiatric:        Mood and Affect: Mood normal.        Speech: Speech normal.        Behavior: Behavior normal.     (all labs ordered are listed, but only abnormal results are displayed) Labs Reviewed - No data to display  EKG: None  Radiology: No results found.   Procedures   Medications Ordered in the ED  ketorolac  (TORADOL ) injection 60 mg (has no administration in time range)  dexamethasone  (DECADRON ) injection 10 mg (has no administration in time range)                                    Medical Decision Making Risk Prescription drug management.   Differential Diagnosis considered includes, but not limited to: Muscle strain; acute disc herniation; lumbar fracture; cauda equina syndrome; infection; malignancy  Patient presents to the ER with musculoskeletal back pain. Examination reveals back tenderness without any associated neurologic findings. Patient's strength, sensation and reflexes were normal. There is no evidence of saddle anesthesia. Patient does not have a foot drop. Patient has not experienced any change in bowel or bladder function. As such, patient did not require any imaging or further studies. Patient was treated with analgesia.     Final diagnoses:  Sciatica of left side    ED Discharge Orders          Ordered    methylPREDNISolone  (MEDROL  DOSEPAK) 4 MG TBPK tablet        11/13/23 0033    methocarbamol  (ROBAXIN ) 500 MG tablet  Every 8 hours PRN        11/13/23 0033    lisinopril  (ZESTRIL ) 10 MG tablet  Daily        11/13/23 0034               Haze Lonni PARAS, MD 11/13/23 [phone]
